# Patient Record
Sex: Female | Born: 2000 | Race: Black or African American | Hispanic: No | Marital: Single | Smoking: Never smoker
Health system: Southern US, Community
[De-identification: ages and names within clinical notes are randomized; demographics above are authoritative.]

## PROBLEM LIST (undated history)

## (undated) DIAGNOSIS — I10 Essential (primary) hypertension: Secondary | ICD-10-CM

## (undated) DIAGNOSIS — E119 Type 2 diabetes mellitus without complications: Secondary | ICD-10-CM

---
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
## 2019-12-07 IMAGING — CT CT HEAD W/O CM
3 series · 15 of 46 positions shown, 18 images · non-contrast
Comparison: None.

CLINICAL DATA: Acute onset of facial droop and twitching.

EXAM:
CT HEAD WITHOUT CONTRAST
TECHNIQUE: Contiguous axial images were obtained from the base of the skull
through the vertex without intravenous contrast.

[Series 2: head wo · axial · 0.45mm/px · z∈[+1117,+1237]mm · 9 of 29 slices shown, 12 images]
[im 3/29  brain]
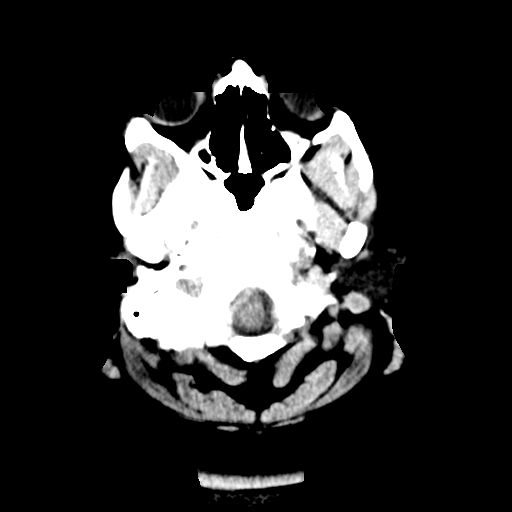
[im 3/29  bone]
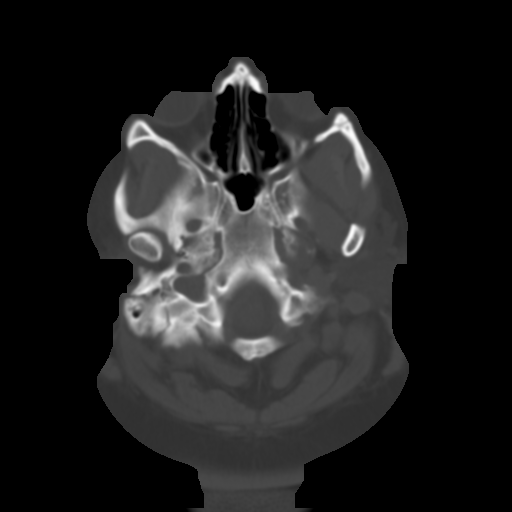
[im 6/29  brain]
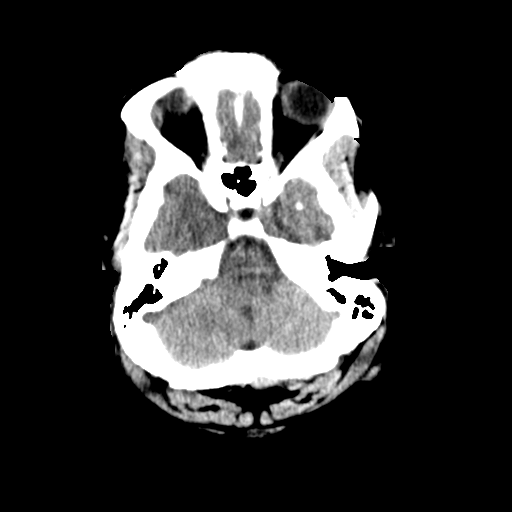
[im 9/29  brain]
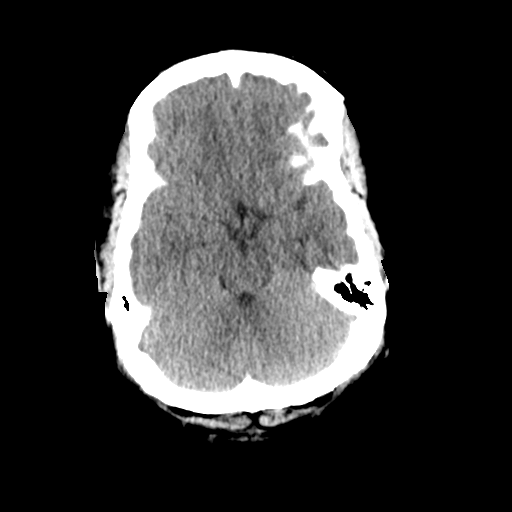
[im 12/29  brain]
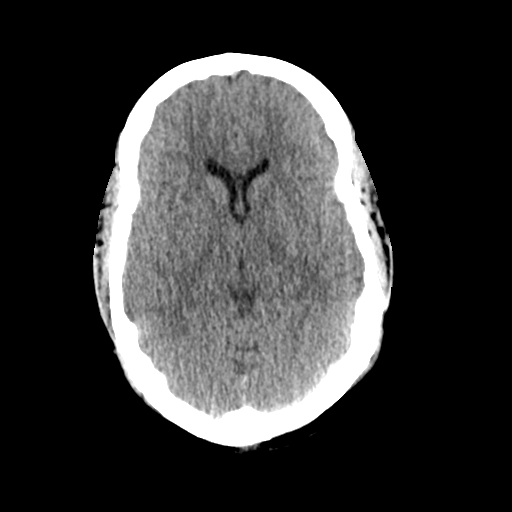
[im 15/29  brain]
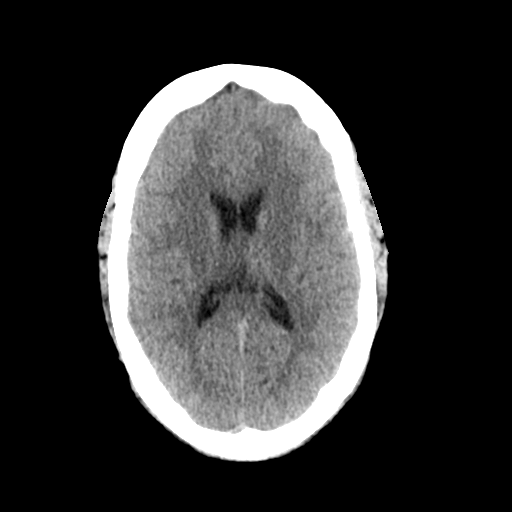
[im 15/29  bone]
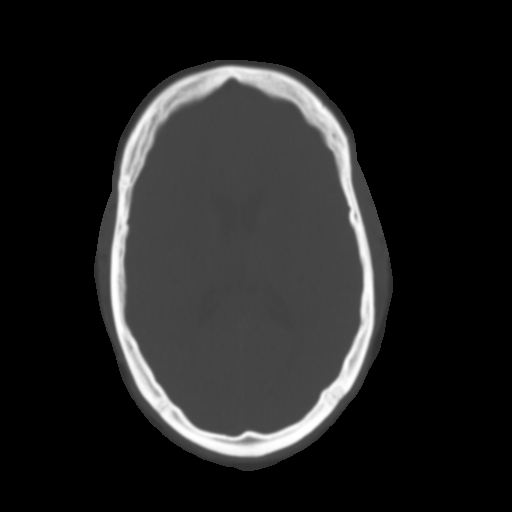
[im 18/29  brain]
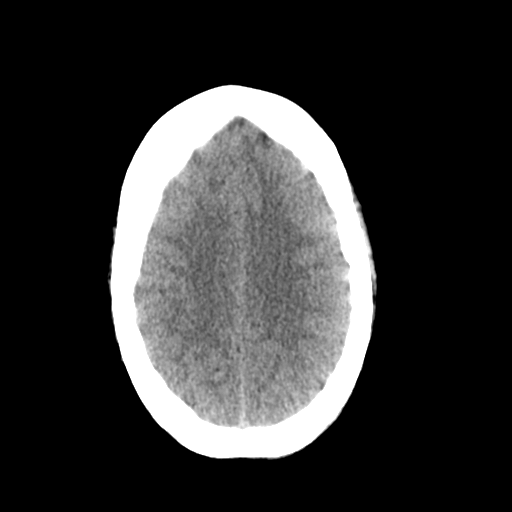
[im 21/29  brain]
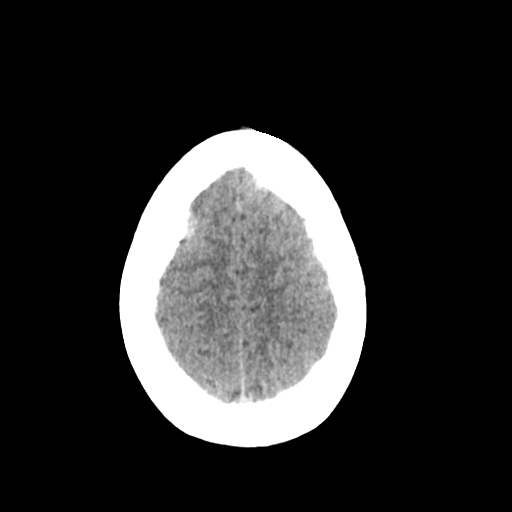
[im 24/29  brain]
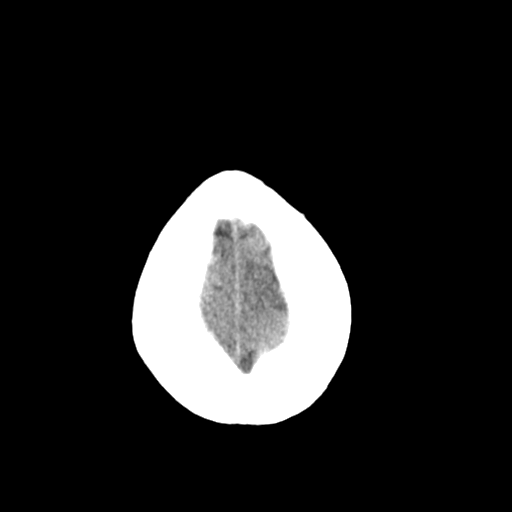
[im 27/29  brain]
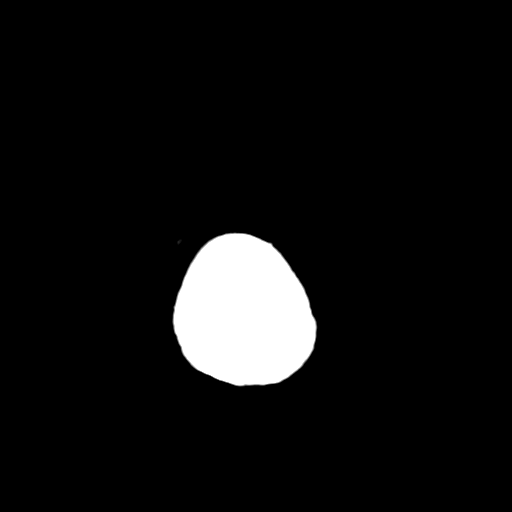
[im 27/29  bone]
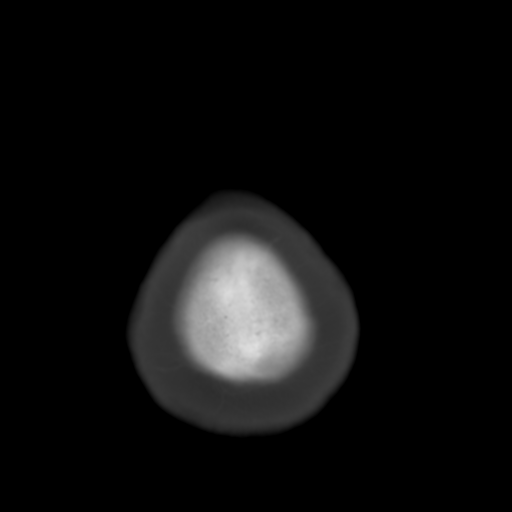

[Series 4: coronal soft tissue · coronal · 0.31mm/px · 3 of 67 slices shown]
[im 23/67  brain]
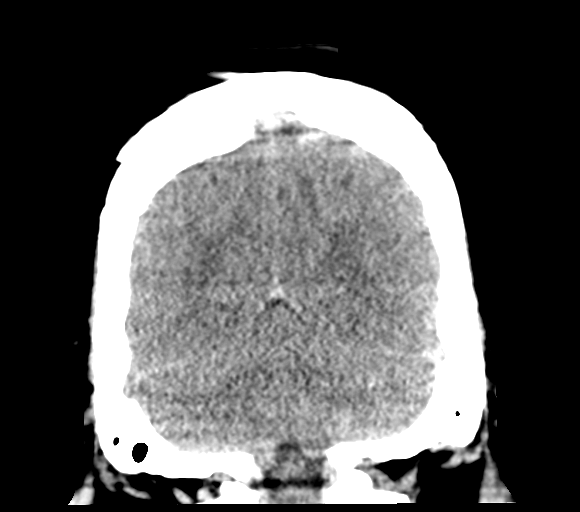
[im 30/67  brain]
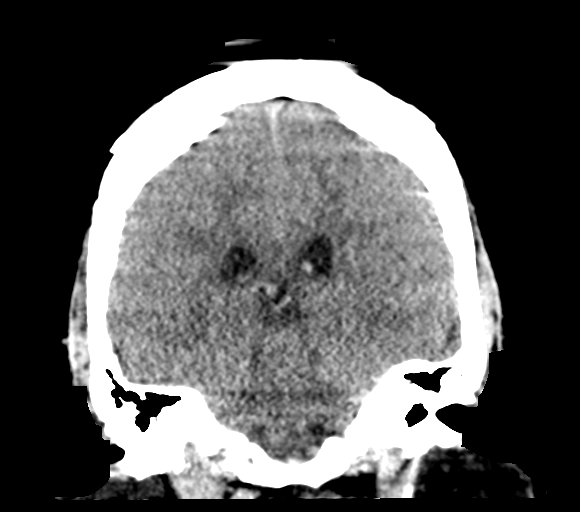
[im 37/67  brain]
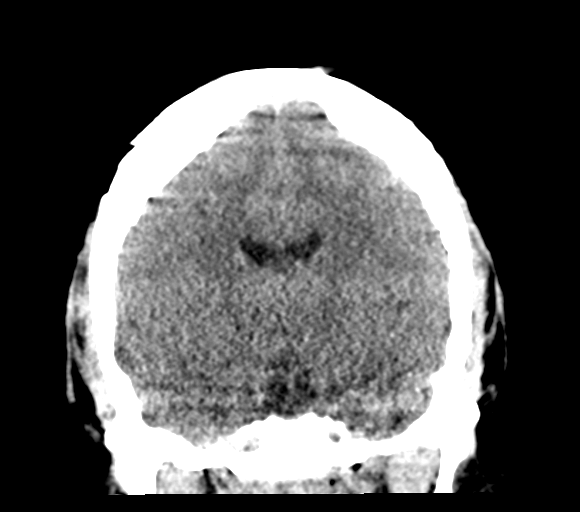

[Series 5: sagittal soft tissue · sagittal · 0.33mm/px · 3 of 55 slices shown]
[im 19/55  brain]
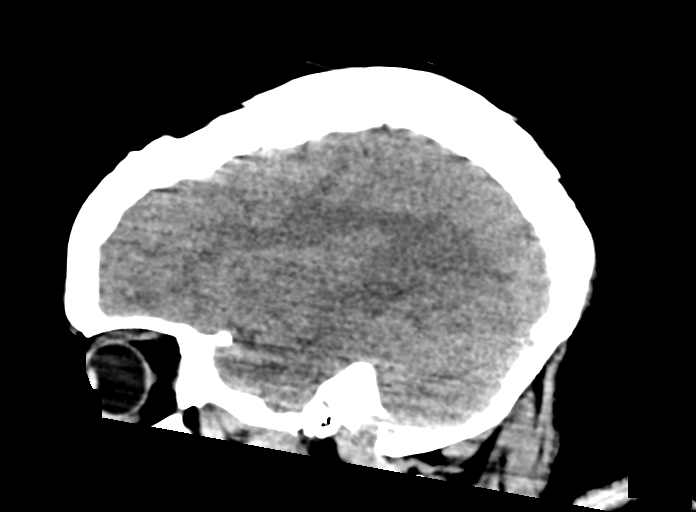
[im 28/55  brain]
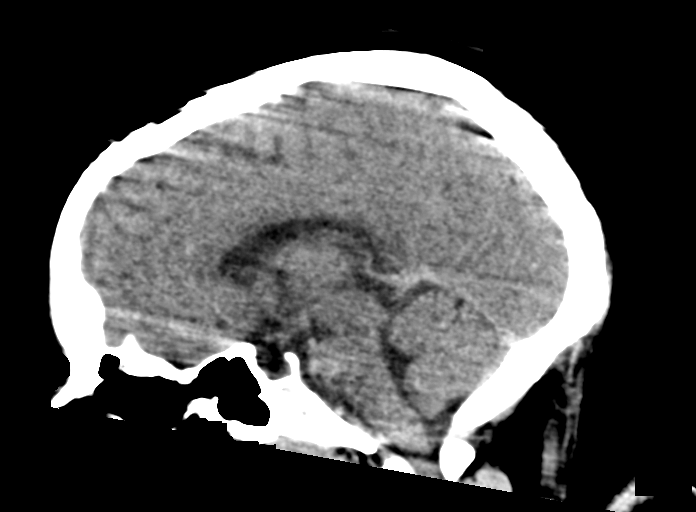
[im 37/55  brain]
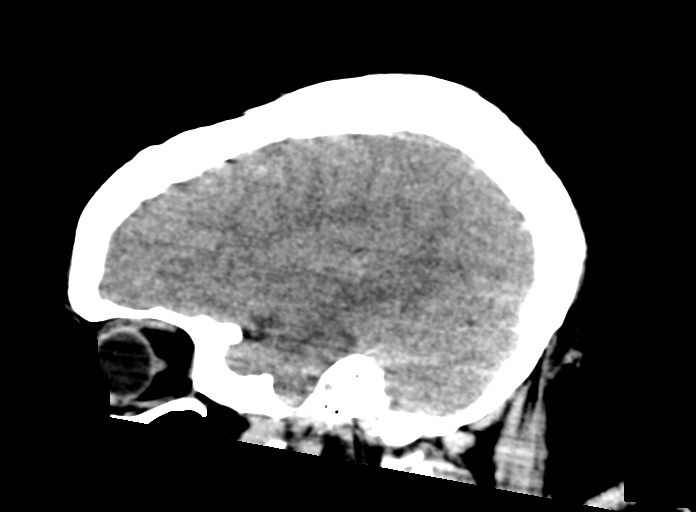

[15 of 46 positions shown; findings below may reference images not displayed]

FINDINGS: Brain: No evidence of acute infarction, hemorrhage, hydrocephalus,
extra-axial collection or mass lesion/mass effect.

The posterior fossa, including the cerebellum, brainstem and fourth
ventricle, is within normal limits. The third and lateral
ventricles, and basal ganglia are unremarkable in appearance. The
cerebral hemispheres are symmetric in appearance, with normal
gray-white differentiation. No mass effect or midline shift is seen.

There is question of slight congenital narrowing of the proximal
cervical spinal canal. Would correlate for any associated symptoms,
and consider MRI of the cervical spine if deemed clinically
appropriate.

Vascular: No hyperdense vessel or unexpected calcification.

Skull: There is no evidence of fracture; visualized osseous
structures are unremarkable in appearance.

Sinuses/Orbits: The visualized portions of the orbits are within
normal limits. The paranasal sinuses and mastoid air cells are
well-aerated.

Other: No significant soft tissue abnormalities are seen.
IMPRESSION: 1. No acute intracranial pathology seen on CT.
2. Question of slight congenital narrowing of the proximal cervical
spinal canal. Would correlate for any associated symptoms, and
consider MRI of the cervical spine if deemed clinically appropriate.
